# Patient Record
Sex: Female | Born: 1970 | Race: Black or African American | Hispanic: No | Marital: Married | Smoking: Never smoker
Health system: Southern US, Community
[De-identification: ages and names within clinical notes are randomized; demographics above are authoritative.]

## PROBLEM LIST (undated history)

## (undated) HISTORY — PX: KNEE SURGERY: SHX244

---

## 2002-02-04 ENCOUNTER — Emergency Department (HOSPITAL_COMMUNITY): Admission: EM | Admit: 2002-02-04 | Discharge: 2002-02-04 | Payer: Self-pay

## 2012-09-15 ENCOUNTER — Encounter: Payer: Self-pay | Admitting: *Deleted

## 2012-09-15 ENCOUNTER — Emergency Department
Admission: EM | Admit: 2012-09-15 | Discharge: 2012-09-15 | Disposition: A | Discharge status: Home / Self Care | Payer: 59 | Source: Home / Self Care | Attending: Emergency Medicine | Admitting: Emergency Medicine

## 2012-09-15 DIAGNOSIS — J069 Acute upper respiratory infection, unspecified: Secondary | ICD-10-CM

## 2012-09-15 LAB — POCT INFLUENZA A/B: Influenza B, POC: NEGATIVE

## 2012-09-15 MED ORDER — AZITHROMYCIN 250 MG PO TABS: ORAL_TABLET | ORAL | Status: AC

## 2012-09-15 NOTE — ED Notes (Signed)
Pt has been feeling sick for 3 days.  Cough fever chills body aches came on all of a sudden Thursday morning.  Mucinex, nyquil with no relief

## 2012-09-15 NOTE — ED Provider Notes (Signed)
History     CSN: [id number]  Arrival date & time 09/15/12  1613   First MD Initiated Contact with Patient 09/15/12 1629      No chief complaint on file.   (Consider location/radiation/quality/duration/timing/severity/associated sxs/prior treatment) HPI Becky Lester is a 42 y.o. female who complains of onset of cold symptoms for 3 days.  The symptoms are constant and mild-moderate in severity.  Today is the worst day.  No flu shot this year.  Husband and son were sick but now better.  Daughter is starting to get sick. No sore throat + cough No pleuritic pain No wheezing No nasal congestion No post-nasal drainage No sinus pain/pressure No chest congestion No itchy/red eyes No earache No hemoptysis No SOB + chills/sweats + fever No nausea No vomiting No abdominal pain No diarrhea No skin rashes + fatigue + myalgias No headache    No past medical history on file.  No past surgical history on file.  No family history on file.  History  Substance Use Topics  . Smoking status: Not on file  . Smokeless tobacco: Not on file  . Alcohol Use: Not on file    OB History    No data available      Review of Systems  All other systems reviewed and are negative.    Allergies  Review of patient's allergies indicates not on file.  Home Medications  No current outpatient prescriptions on file.  There were no vitals taken for this visit.  Physical Exam  Nursing note and vitals reviewed. Constitutional: She is oriented to person, place, and time. She appears well-developed and well-nourished.  HENT:  Head: Normocephalic and atraumatic.  Right Ear: Tympanic membrane, external ear and ear canal normal.  Left Ear: Tympanic membrane, external ear and ear canal normal.  Nose: Mucosal edema and rhinorrhea present.  Mouth/Throat: Posterior oropharyngeal erythema present. No oropharyngeal exudate or posterior oropharyngeal edema.  Eyes: No scleral icterus.  Neck: Neck  supple.  Cardiovascular: Regular rhythm and normal heart sounds.   Pulmonary/Chest: Effort normal and breath sounds normal. No respiratory distress.  Neurological: She is alert and oriented to person, place, and time.  Skin: Skin is warm and dry.  Psychiatric: She has a normal mood and affect. Her speech is normal.    ED Course  Procedures (including critical care time)  Labs Reviewed - No data to display No results found.   1. Acute upper respiratory infections of unspecified site       MDM  1)  Take the prescribed antibiotic as instructed.  Sounds flu-like, but rapid flu negative.  Will give Zpak which she will hold off until tomorrow which is day 4.  If viral or flu, may start feeling better by then.  Too late for Tamiflu for patient. 2)  Use nasal saline solution (over the counter) at least 3 times a day. 3)  Use over the counter decongestants like Zyrtec-D every 12 hours as needed to help with congestion.  If you have hypertension, do not take medicines with sudafed.  4)  Can take tylenol every 6 hours or motrin every 8 hours for pain or fever. 5)  Follow up with your primary doctor if no improvement in 5-7 days, sooner if increasing pain, fever, or new symptoms.     Marlaine Hind, MD 09/15/12 [phone]

## 2019-11-19 ENCOUNTER — Other Ambulatory Visit: Payer: Self-pay

## 2019-11-19 ENCOUNTER — Encounter (HOSPITAL_COMMUNITY): Payer: Self-pay | Admitting: Emergency Medicine

## 2019-11-19 ENCOUNTER — Emergency Department (HOSPITAL_COMMUNITY): Payer: BC Managed Care – PPO

## 2019-11-19 ENCOUNTER — Emergency Department (HOSPITAL_COMMUNITY)
Admission: EM | Admit: 2019-11-19 | Discharge: 2019-11-19 | Disposition: A | Discharge status: Home / Self Care | Payer: BC Managed Care – PPO | Attending: Emergency Medicine | Admitting: Emergency Medicine

## 2019-11-19 DIAGNOSIS — M542 Cervicalgia: Secondary | ICD-10-CM | POA: Diagnosis not present

## 2019-11-19 DIAGNOSIS — S0921XA Traumatic rupture of right ear drum, initial encounter: Secondary | ICD-10-CM | POA: Diagnosis not present

## 2019-11-19 DIAGNOSIS — T07XXXA Unspecified multiple injuries, initial encounter: Secondary | ICD-10-CM | POA: Diagnosis present

## 2019-11-19 DIAGNOSIS — S0083XA Contusion of other part of head, initial encounter: Secondary | ICD-10-CM | POA: Diagnosis not present

## 2019-11-19 DIAGNOSIS — R42 Dizziness and giddiness: Secondary | ICD-10-CM | POA: Diagnosis not present

## 2019-11-19 DIAGNOSIS — Y939 Activity, unspecified: Secondary | ICD-10-CM | POA: Insufficient documentation

## 2019-11-19 DIAGNOSIS — H0589 Other disorders of orbit: Secondary | ICD-10-CM | POA: Diagnosis not present

## 2019-11-19 DIAGNOSIS — H7291 Unspecified perforation of tympanic membrane, right ear: Secondary | ICD-10-CM

## 2019-11-19 DIAGNOSIS — Y999 Unspecified external cause status: Secondary | ICD-10-CM | POA: Insufficient documentation

## 2019-11-19 DIAGNOSIS — Y92019 Unspecified place in single-family (private) house as the place of occurrence of the external cause: Secondary | ICD-10-CM | POA: Insufficient documentation

## 2019-11-19 MED ORDER — PROCHLORPERAZINE EDISYLATE 10 MG/2ML IJ SOLN
10.0000 mg | Freq: Once | INTRAMUSCULAR | Status: AC
  Administered 2019-11-19: 10 mg via INTRAVENOUS
  Filled 2019-11-19: qty 2

## 2019-11-19 MED ORDER — HYDROCODONE-ACETAMINOPHEN 5-325 MG PO TABS
1.0000 | ORAL_TABLET | Freq: Once | ORAL | Status: AC
  Administered 2019-11-19: 1 via ORAL
  Filled 2019-11-19: qty 1

## 2019-11-19 MED ORDER — MECLIZINE HCL 25 MG PO TABS: 25.0000 mg | ORAL_TABLET | Freq: Three times a day (TID) | ORAL | 0 refills | Status: AC | PRN

## 2019-11-19 MED ORDER — HYDROMORPHONE HCL 1 MG/ML IJ SOLN
1.0000 mg | Freq: Once | INTRAMUSCULAR | Status: AC
  Administered 2019-11-19: 1 mg via INTRAMUSCULAR
  Filled 2019-11-19: qty 1

## 2019-11-19 MED ORDER — ONDANSETRON 4 MG PO TBDP
4.0000 mg | ORAL_TABLET | Freq: Once | ORAL | Status: AC
  Administered 2019-11-19: 4 mg via ORAL
  Filled 2019-11-19: qty 1

## 2019-11-19 MED ORDER — DIPHENHYDRAMINE HCL 50 MG/ML IJ SOLN
12.5000 mg | Freq: Once | INTRAMUSCULAR | Status: AC
  Administered 2019-11-19: 12.5 mg via INTRAVENOUS
  Filled 2019-11-19: qty 1

## 2019-11-19 MED ORDER — MECLIZINE HCL 25 MG PO TABS
25.0000 mg | ORAL_TABLET | Freq: Once | ORAL | Status: AC
  Administered 2019-11-19: 25 mg via ORAL
  Filled 2019-11-19: qty 1

## 2019-11-19 MED ORDER — KETOROLAC TROMETHAMINE 30 MG/ML IJ SOLN
30.0000 mg | Freq: Once | INTRAMUSCULAR | Status: AC
  Administered 2019-11-19: 30 mg via INTRAVENOUS
  Filled 2019-11-19: qty 1

## 2019-11-19 MED ORDER — SODIUM CHLORIDE 0.9 % IV BOLUS
1000.0000 mL | Freq: Once | INTRAVENOUS | Status: AC
  Administered 2019-11-19: 1000 mL via INTRAVENOUS

## 2019-11-19 MED ORDER — OFLOXACIN 0.3 % OT SOLN: 5.0000 [drp] | Freq: Two times a day (BID) | OTIC | 0 refills | Status: AC

## 2019-11-19 NOTE — ED Notes (Signed)
Patient returned from CT

## 2019-11-19 NOTE — ED Notes (Signed)
Brandon PA at bedside. 
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
## 2019-11-19 NOTE — ED Notes (Signed)
[employer] officer Huges to bedside to speak with pt.
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
## 2022-05-16 ENCOUNTER — Ambulatory Visit (INDEPENDENT_AMBULATORY_CARE_PROVIDER_SITE_OTHER): Payer: PRIVATE HEALTH INSURANCE | Admitting: Podiatry

## 2022-05-16 ENCOUNTER — Ambulatory Visit (INDEPENDENT_AMBULATORY_CARE_PROVIDER_SITE_OTHER): Payer: PRIVATE HEALTH INSURANCE

## 2022-05-16 DIAGNOSIS — M7751 Other enthesopathy of right foot: Secondary | ICD-10-CM

## 2022-05-16 DIAGNOSIS — M7752 Other enthesopathy of left foot: Secondary | ICD-10-CM

## 2022-05-16 MED ORDER — BETAMETHASONE SOD PHOS & ACET 6 (3-3) MG/ML IJ SUSP
3.0000 mg | Freq: Once | INTRAMUSCULAR | Status: AC
  Administered 2022-05-16: 3 mg via INTRA_ARTICULAR

## 2022-05-16 MED ORDER — METHYLPREDNISOLONE 4 MG PO TBPK: ORAL_TABLET | ORAL | 0 refills | Status: DC

## 2022-05-16 MED ORDER — MELOXICAM 15 MG PO TABS: 15.0000 mg | ORAL_TABLET | Freq: Every day | ORAL | 1 refills | Status: AC

## 2022-05-16 NOTE — Progress Notes (Signed)
   Chief Complaint  Patient presents with   Foot Pain    The patient is here for bilateral foot pain that she has had for years, she has had injection in the right foot  before.patient states that the injection helped a lot and would like the injection.    HPI: 51 y.o. female presenting today as a new patient for evaluation of second toe joint pain that has been going on for about 7-8 months now.  Patient states that several years ago she had cortisone injections into the second MTP joint which helped significantly alleviate her pain and symptoms.  She says that about 7-8 months ago she went to a concert and was on her feet and that is when the pain returned.  She presents for further treatment and evaluation  No past medical history on file.  Past Surgical History:  Procedure Laterality Date   KNEE SURGERY      No Known Allergies   Physical Exam: General: The patient is alert and oriented x3 in no acute distress.  Dermatology: Skin is warm, dry and supple bilateral lower extremities. Negative for open lesions or macerations.  Vascular: Palpable pedal pulses bilaterally. Capillary refill within normal limits.  Negative for any significant edema or erythema  Neurological: Light touch and protective threshold grossly intact  Musculoskeletal Exam: Mild to moderate hallux valgus deformity noted.  There is pain on palpation and range of motion of the second MTP bilateral  Radiographic Exam:  Normal osseous mineralization. Joint spaces preserved.  Mild increased intermetatarsal space with increased intermetatarsal angle and hallux abductus angle noted bilateral right greater than the left  Assessment: 1.  Mild to moderate hallux valgus bilateral; asymptomatic 2.  Second MTP capsulitis bilateral   Plan of Care:  1. Patient evaluated. X-Rays reviewed.  2.  Injection of 0.5 cc Celestone Soluspan injection second MTP bilateral 3.  Prescription for Medrol Dosepak 4.  Prescription for  meloxicam 15 mg daily 5.  Advised against going barefoot.  Recommend good supportive shoes and sneakers with arch supports to offload pressure from the forefoot 6.  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 Edrick Kins, DPM    [address], [place] [address]                Office [phone]  Fax [phone]

## 2022-05-16 NOTE — Progress Notes (Signed)
Dg  

## 2022-09-06 ENCOUNTER — Ambulatory Visit (INDEPENDENT_AMBULATORY_CARE_PROVIDER_SITE_OTHER): Payer: Commercial Managed Care - PPO | Admitting: Podiatry

## 2022-09-06 VITALS — BP 151/90 | HR 88

## 2022-09-06 DIAGNOSIS — M7752 Other enthesopathy of left foot: Secondary | ICD-10-CM | POA: Diagnosis not present

## 2022-09-06 DIAGNOSIS — M7751 Other enthesopathy of right foot: Secondary | ICD-10-CM

## 2022-09-06 MED ORDER — BETAMETHASONE SOD PHOS & ACET 6 (3-3) MG/ML IJ SUSP
3.0000 mg | Freq: Once | INTRAMUSCULAR | Status: AC
  Administered 2022-09-06: 3 mg via INTRA_ARTICULAR

## 2022-09-06 NOTE — Progress Notes (Signed)
   Chief Complaint  Patient presents with   Foot Pain    Capsulitis bilateral foot, patient is having pain in the forefoot under the hallux, patient is wanting an injections,     HPI: 52 y.o. female presenting today for follow-up evaluation of second toe joint pain that has been going on for about 1 year now.  Patient states that the cortisone injection she received last visit helped for about 3 months.  Slowly the pain is returned.  She presents for further treatment and evaluation No past medical history on file.  Past Surgical History:  Procedure Laterality Date   KNEE SURGERY      No Known Allergies   Physical Exam: General: The patient is alert and oriented x3 in no acute distress.  Dermatology: Skin is warm, dry and supple bilateral lower extremities. Negative for open lesions or macerations.  Vascular: Palpable pedal pulses bilaterally. Capillary refill within normal limits.  Negative for any significant edema or erythema  Neurological: Light touch and protective threshold grossly intact  Musculoskeletal Exam: Mild to moderate hallux valgus deformity noted.  There is pain on palpation and range of motion of the second MTP bilateral  Radiographic Exam B/L feet 05/16/2022:  Normal osseous mineralization. Joint spaces preserved.  Mild increased intermetatarsal space with increased intermetatarsal angle and hallux abductus angle noted bilateral right greater than the left  Assessment: 1.  Mild to moderate hallux valgus bilateral; asymptomatic 2.  Second MTP capsulitis bilateral   Plan of Care:  1. Patient evaluated. X-Rays reviewed again today explaining the protruding length of the second metatarsal head extending beyond the normal metatarsal parabola.  2.  Injection of 0.5 cc Celestone Soluspan injection second MTP bilateral 3.  Continue to advise against going barefoot.  Patient states that she does go barefoot around the house 4.  Today we did discuss the possibility of  surgery which would include shortening osteotomies of the second metatarsals bilateral.  This was explained in detail to the patient.  This is something to consider if she continues to fail conservative treatment 5.  Patient has a pair of custom molded orthotics but she found them very uncomfortable.  She states that she will try to wear them again.  These orthotics were dispensed in [place], [place] 6.  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 Edrick Kins, DPM    [address], [place] [address]                Office [phone]  Fax [phone]

## 2023-01-17 ENCOUNTER — Ambulatory Visit: Payer: Commercial Managed Care - PPO | Admitting: Podiatry

## 2023-01-25 ENCOUNTER — Ambulatory Visit (INDEPENDENT_AMBULATORY_CARE_PROVIDER_SITE_OTHER): Payer: Commercial Managed Care - PPO | Admitting: Podiatrist

## 2023-01-25 ENCOUNTER — Encounter: Payer: Self-pay | Admitting: Podiatrist

## 2023-01-25 DIAGNOSIS — M7751 Other enthesopathy of right foot: Secondary | ICD-10-CM

## 2023-01-25 DIAGNOSIS — M7752 Other enthesopathy of left foot: Secondary | ICD-10-CM

## 2023-01-25 MED ORDER — DEXAMETHASONE SODIUM PHOSPHATE 120 MG/30ML IJ SOLN
4.0000 mg | Freq: Once | INTRAMUSCULAR | Status: AC
Start: 2023-01-25 — End: 2023-01-25
  Administered 2023-01-25: 4 mg via INTRA_ARTICULAR

## 2023-01-25 NOTE — Progress Notes (Signed)
Chief Complaint  Patient presents with   Injections    Pt stated that she normally sees Becky Lester and he gives her injections for her feet and she is wanting to get them in both today      HPI: Patient is 52 y.o. female who presents today for discomfort second metatarsal phalangeal joint bilateral.  She states he normally sees Becky Lester and relates that the injections she has gotten in the past have helped.  She also relates that she knows she cannot continue to get injections and that surgery may be needed in the future.   No Known Allergies  Review of systems is negative except as noted in the HPI.  Denies nausea/ vomiting/ fevers/ chills or night sweats.   Denies difficulty breathing, denies calf pain or tenderness  Physical Exam  Patient is awake, alert, and oriented x 3.  In no acute distress.    Vascular status is intact with palpable pedal pulses DP and PT bilateral and capillary refill time less than 3 seconds bilateral.  No edema or erythema noted.   Neurological exam reveals epicritic and protective sensation grossly intact bilateral.   Dermatological exam reveals skin is supple and dry to bilateral feet.  No open lesions present.    Musculoskeletal exam: Elongated second digit is noted.  Slight dorsiflexion upon forefoot loading of the second digit is also seen.  This is a bilateral finding. Elongated second metatarsals with medial deviation of the second digits is noted bilateral.  Pain on palpation segment of tarsal phalangeal joints noted bilateral.  Assessment:   ICD-10-CM   1. Capsulitis of metatarsophalangeal (MTP) joint of left foot  M77.52     2. Capsulitis of metatarsophalangeal (MTP) joint of right foot  M77.51        Plan: Discussed exam findings with Becky Lester at today's visit.  Discussed her specific capsulitis and also discussed benefits and risks of the injections.  Specifically discussed that the injections could cause the toes to drift medial/lateral or  dorsally.  Patient is aware of the risks and states that the pain is such that she would like to proceed.  I did agree up of the skin with alcohol infiltrated 0.5 cc of dexamethasone with 0.5 cc of Marcaine plain medial to the second metatarsal phalangeal joint bilateral.  Also recommended that she take the toes in a downward position.   Recommended she follow-up with Becky Lester to discuss treatment options further.

## 2023-04-09 ENCOUNTER — Ambulatory Visit (INDEPENDENT_AMBULATORY_CARE_PROVIDER_SITE_OTHER): Payer: Commercial Managed Care - PPO

## 2023-04-09 ENCOUNTER — Encounter: Payer: Self-pay | Admitting: Podiatry

## 2023-04-09 ENCOUNTER — Ambulatory Visit (INDEPENDENT_AMBULATORY_CARE_PROVIDER_SITE_OTHER): Payer: Commercial Managed Care - PPO | Admitting: Podiatry

## 2023-04-09 DIAGNOSIS — M2012 Hallux valgus (acquired), left foot: Secondary | ICD-10-CM

## 2023-04-09 DIAGNOSIS — M7752 Other enthesopathy of left foot: Secondary | ICD-10-CM

## 2023-04-09 DIAGNOSIS — M7751 Other enthesopathy of right foot: Secondary | ICD-10-CM

## 2023-04-09 DIAGNOSIS — M2011 Hallux valgus (acquired), right foot: Secondary | ICD-10-CM

## 2023-04-09 NOTE — Progress Notes (Unsigned)
   Chief Complaint  Patient presents with   Consult    Discuss surgery - continued forefoot pain, having to get injections every 2-3 months, wants to talk about surgical options    HPI: 52 y.o. female presenting today for follow-up evaluation of second toe joint pain that has been going on for about 1 year now.  Patient states that the cortisone injection she received last visit helped for about 3 months.  Slowly the pain is returned.  She presents for further treatment and evaluation No past medical history on file.  Past Surgical History:  Procedure Laterality Date   KNEE SURGERY      No Known Allergies   Physical Exam: General: The patient is alert and oriented x3 in no acute distress.  Dermatology: Skin is warm, dry and supple bilateral lower extremities. Negative for open lesions or macerations.  Vascular: Palpable pedal pulses bilaterally. Capillary refill within normal limits.  Negative for any significant edema or erythema  Neurological: Light touch and protective threshold grossly intact  Musculoskeletal Exam: Mild to moderate hallux valgus deformity noted.  There is pain on palpation and range of motion of the second MTP bilateral  Radiographic Exam B/L feet 05/16/2022:  Normal osseous mineralization. Joint spaces preserved.  Mild increased intermetatarsal space with increased intermetatarsal angle and hallux abductus angle noted bilateral right greater than the left  Assessment: 1.  Mild to moderate hallux valgus bilateral; asymptomatic 2.  Second MTP capsulitis bilateral   Plan of Care:  1. Patient evaluated. X-Rays reviewed again today explaining the protruding length of the second metatarsal head extending beyond the normal metatarsal parabola.  2.  Injection of 0.5 cc Celestone Soluspan injection second MTP bilateral 3.  Continue to advise against going barefoot.  Patient states that she does go barefoot around the house 4.  Today we did discuss the possibility  of surgery which would include shortening osteotomies of the second metatarsals bilateral.  This was explained in detail to the patient.  This is something to consider if she continues to fail conservative treatment 5.  Patient has a pair of custom molded orthotics but she found them very uncomfortable.  She states that she will try to wear them again.  These orthotics were dispensed in [place], [place] 6.  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 Felecia Shelling, DPM    [address] [place], [place] [address]                Office [phone]  Fax [phone]

## 2023-04-10 DIAGNOSIS — M7752 Other enthesopathy of left foot: Secondary | ICD-10-CM | POA: Diagnosis not present

## 2023-04-10 DIAGNOSIS — M7751 Other enthesopathy of right foot: Secondary | ICD-10-CM

## 2023-04-10 MED ORDER — BETAMETHASONE SOD PHOS & ACET 6 (3-3) MG/ML IJ SUSP
3.0000 mg | Freq: Once | INTRAMUSCULAR | Status: AC
Start: 2023-04-10 — End: 2023-04-10
  Administered 2023-04-10: 3 mg via INTRA_ARTICULAR

## 2023-07-02 ENCOUNTER — Ambulatory Visit (INDEPENDENT_AMBULATORY_CARE_PROVIDER_SITE_OTHER): Payer: Commercial Managed Care - PPO | Admitting: Podiatry

## 2023-07-02 ENCOUNTER — Encounter: Payer: Self-pay | Admitting: Podiatry

## 2023-07-02 VITALS — Ht 66.0 in | Wt 145.0 lb

## 2023-07-02 DIAGNOSIS — M7751 Other enthesopathy of right foot: Secondary | ICD-10-CM | POA: Diagnosis not present

## 2023-07-02 DIAGNOSIS — M7752 Other enthesopathy of left foot: Secondary | ICD-10-CM | POA: Diagnosis not present

## 2023-07-02 MED ORDER — BETAMETHASONE SOD PHOS & ACET 6 (3-3) MG/ML IJ SUSP
3.0000 mg | Freq: Once | INTRAMUSCULAR | Status: AC
  Administered 2023-07-02: 3 mg via INTRA_ARTICULAR

## 2023-07-02 NOTE — Progress Notes (Signed)
   Chief Complaint  Patient presents with   Foot Pain    Patient is here for 16M F/U for Mild to moderate hallux valgus bilateral; asymptomatic,  Second MTP capsulitis bilateral with elongated metatarsals  Patient is here for injection and schedule surgery    HPI: 52 y.o. female presenting today for follow-up evaluation of second toe joint pain that has been going on for about 1 year now.  Patient continues to have pain and tenderness associated to the second toe joint.  Injections have helped temporarily in the past  History reviewed. No pertinent past medical history.  Past Surgical History:  Procedure Laterality Date   KNEE SURGERY      No Known Allergies   Physical Exam: General: The patient is alert and oriented x3 in no acute distress.  Dermatology: Skin is warm, dry and supple bilateral lower extremities. Negative for open lesions or macerations.  Vascular: Palpable pedal pulses bilaterally. Capillary refill within normal limits.  Negative for any significant edema or erythema  Neurological: Light touch and protective threshold grossly intact  Musculoskeletal Exam: Mild to moderate hallux valgus deformity noted.  There is pain on palpation and range of motion of the second MTP bilateral  Radiographic Exam B/L feet 04/10/2023:  Normal osseous mineralization. Joint spaces preserved.  Mild increased intermetatarsal space with increased intermetatarsal angle and hallux abductus angle noted bilateral right greater than the left.  The second metatarsal heads do extend beyond the metatarsal parabola likely contributing to the patient's pain specifically to the second MTP  Assessment: 1.  Mild to moderate hallux valgus bilateral; asymptomatic 2.  Second MTP capsulitis bilateral with elongated metatarsals    Plan of Care:  -Patient evaluated. X-Rays reviewed again today explaining the protruding length of the second metatarsal head extending beyond the normal metatarsal parabola.   -Injection of 0.5 cc Celestone Soluspan injection second MTP bilateral - Authorization for surgery was initiated today.  Surgery will consist of Weil shortening osteotomy second metatarsal bilateral -Risk benefits advantages disadvantages of the procedure as well as the postoperative recovery course were explained in detail to the patient.  No guarantees were expressed or implied.  All patient questions answered.  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 Felecia Lester, DPM    [address] [place], [place] [address]                Office [phone]  Fax [phone]

## 2023-07-04 ENCOUNTER — Telehealth: Payer: Self-pay

## 2023-07-04 NOTE — Telephone Encounter (Signed)
Received surgery paperwork from Dr. Logan Bores. Tried calling the number listed but it is not longer in service.

## 2023-08-03 ENCOUNTER — Telehealth: Payer: Self-pay | Admitting: Podiatry

## 2023-08-03 NOTE — Telephone Encounter (Signed)
DOS-09/06/23  MET. OSTEOTOMY 2ND [id number]  COVERAGE EFFECTIVE DATE- 08/14/21  DEDUCTIBLE- $750.00 WITH REMAINING $0.00 OOP-$3000.00 WITH REMAINING $1973.00  COINSURANCE- 20%  SPOKE WITH SHEN D FROM [facility] SERVICES, AND SHE STATED THAT PRIOR AUTH IS NOT REQUIRED FOR CPT CODE 09811  CALL REFERENCE #: [id number]

## 2023-08-30 ENCOUNTER — Telehealth: Payer: Self-pay | Admitting: Podiatry

## 2023-08-30 NOTE — Telephone Encounter (Signed)
Aram Beecham from [facility] called and stated that pt has called and would like to reschedule her surgery with Dr.Evans

## 2023-08-31 ENCOUNTER — Telehealth: Payer: Self-pay | Admitting: Urology

## 2023-08-31 NOTE — Telephone Encounter (Signed)
Pt called yesterday stating that she needs to cxl her sx with Dr. Logan Bores on 09/06/23, she just finished school and her husband is making her get a job first. She will call back when she is ready to reschedule. Aram Beecham with [facility] and Dr. Logan Bores have been informed of this change.

## 2023-09-12 ENCOUNTER — Encounter: Payer: Commercial Managed Care - PPO | Admitting: Podiatry

## 2023-09-19 ENCOUNTER — Encounter: Payer: Commercial Managed Care - PPO | Admitting: Podiatry

## 2023-10-03 ENCOUNTER — Encounter: Payer: Commercial Managed Care - PPO | Admitting: Podiatry

## 2024-01-15 ENCOUNTER — Ambulatory Visit (INDEPENDENT_AMBULATORY_CARE_PROVIDER_SITE_OTHER): Admitting: Podiatry

## 2024-01-15 ENCOUNTER — Encounter: Payer: Self-pay | Admitting: Podiatry

## 2024-01-15 VITALS — Ht 66.0 in | Wt 145.0 lb

## 2024-01-15 DIAGNOSIS — M7751 Other enthesopathy of right foot: Secondary | ICD-10-CM

## 2024-01-15 DIAGNOSIS — M7752 Other enthesopathy of left foot: Secondary | ICD-10-CM | POA: Diagnosis not present

## 2024-01-15 MED ORDER — BETAMETHASONE SOD PHOS & ACET 6 (3-3) MG/ML IJ SUSP
3.0000 mg | Freq: Once | INTRAMUSCULAR | Status: AC
  Administered 2024-01-15: 3 mg via INTRA_ARTICULAR

## 2024-01-15 NOTE — Progress Notes (Signed)
   Chief Complaint  Patient presents with   Foot Pain    Pt is here due to left foot pain that is constant and been going on for a few months, would like to get injection, she also states she was suppose to have foot surgery in February and had to cancel she would like to get that process going again.     HPI: 53 y.o. female presenting today for follow-up evaluation of second toe joint pain that has been going on for over 1 year now.  Last seen in the office on 07/02/2023 and at that time we had decided to proceed with surgery to correct for the chronically painful second toe joint.  She had to cancel for personal reasons.  Since that time she has continued to have pain and tenderness.  She would like to proceed with surgery and presents today for further treatment  No past medical history on file.  Past Surgical History:  Procedure Laterality Date   KNEE SURGERY      No Known Allergies   Physical Exam: General: The patient is alert and oriented x3 in no acute distress.  Dermatology: Skin is warm, dry and supple bilateral lower extremities. Negative for open lesions or macerations.  Vascular: Palpable pedal pulses bilaterally. Capillary refill within normal limits.  Negative for any significant edema or erythema  Neurological: Light touch and protective threshold grossly intact  Musculoskeletal Exam: Mild to moderate hallux valgus deformity noted.  There is pain on palpation and range of motion of the second MTP bilateral  Radiographic Exam B/L feet 04/10/2023:  Normal osseous mineralization. Joint spaces preserved.  Mild increased intermetatarsal space with increased intermetatarsal angle and hallux abductus angle noted bilateral right greater than the left.  The second metatarsal heads do extend beyond the metatarsal parabola likely contributing to the patient's pain specifically to the second MTP  Assessment: 1.  Mild to moderate hallux valgus bilateral; asymptomatic 2.  Second  MTP capsulitis bilateral with elongated metatarsals    Plan of Care:  -Patient evaluated.  -Injection of 0.5 cc Celestone  Soluspan injection second MTP left foot - Authorization for surgery was reinitiated today.  Surgery will consist of Weil shortening osteotomy second metatarsal bilateral -Risk benefits advantages disadvantages of the procedure as well as the postoperative recovery course were explained in detail to the patient.  No guarantees were expressed or implied.  All patient questions answered.  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 Dot Gazella, DPM    [address] [place], [place] [address]                Office [phone]  Fax [phone]

## 2024-01-31 ENCOUNTER — Telehealth: Payer: Self-pay

## 2024-01-31 NOTE — Telephone Encounter (Signed)
 DOS 02/07/2024  METATARSAL OSTEOTMY 2ND [id number]  [employer] SHARED SERVICES  SPOKE TO HEATHER AT [facility] AND SHE STATED NO AUTH IS REQUIRED FOR CPT 28308. CALL REF# HEATHER T 01/28/2024 203 CST  ALSO SPOKE TO MICHAEL IN PROVIDER SERVICES AND HE ALSO STATED NO PRECERT REQUIRED.CALL REF # [id number]

## 2024-02-07 ENCOUNTER — Other Ambulatory Visit: Payer: Self-pay | Admitting: Podiatry

## 2024-02-07 ENCOUNTER — Encounter: Payer: Self-pay | Admitting: Podiatry

## 2024-02-07 DIAGNOSIS — M21542 Acquired clubfoot, left foot: Secondary | ICD-10-CM | POA: Diagnosis not present

## 2024-02-07 DIAGNOSIS — M21541 Acquired clubfoot, right foot: Secondary | ICD-10-CM | POA: Diagnosis not present

## 2024-02-07 MED ORDER — OXYCODONE-ACETAMINOPHEN 5-325 MG PO TABS: 1.0000 | ORAL_TABLET | ORAL | 0 refills | Status: AC | PRN

## 2024-02-07 MED ORDER — IBUPROFEN 800 MG PO TABS: 800.0000 mg | ORAL_TABLET | Freq: Three times a day (TID) | ORAL | 1 refills | Status: AC

## 2024-02-07 NOTE — Progress Notes (Signed)
 PRN postop

## 2024-02-12 ENCOUNTER — Ambulatory Visit (INDEPENDENT_AMBULATORY_CARE_PROVIDER_SITE_OTHER)

## 2024-02-12 ENCOUNTER — Encounter: Payer: Self-pay | Admitting: Podiatry

## 2024-02-12 ENCOUNTER — Ambulatory Visit (INDEPENDENT_AMBULATORY_CARE_PROVIDER_SITE_OTHER): Admitting: Podiatry

## 2024-02-12 VITALS — Ht 66.0 in | Wt 145.0 lb

## 2024-02-12 DIAGNOSIS — M7751 Other enthesopathy of right foot: Secondary | ICD-10-CM

## 2024-02-12 DIAGNOSIS — M7752 Other enthesopathy of left foot: Secondary | ICD-10-CM | POA: Diagnosis not present

## 2024-02-12 DIAGNOSIS — Z9889 Other specified postprocedural states: Secondary | ICD-10-CM

## 2024-02-12 NOTE — Progress Notes (Signed)
   Chief Complaint  Patient presents with   Routine Post Op    POV # 1 DOS 02/07/24 BIL WEIL SHORTENING OSTEOTOMY 2ND MET, pt states her left foot is giving her some pain when she moves her foot a certain was a shock goes thru the top of foot down to 2nd toe, right foot is doing fine, no other complaints X-RAYS are done.     Subjective:  Patient presents today status post Weil shortening osteotomy to the second digits of bilateral.  DOS: 02/07/2024.  Doing well.  Currently WBAT surgical shoes as instructed.  No past medical history on file.  Past Surgical History:  Procedure Laterality Date   KNEE SURGERY      No Known Allergies  Objective/Physical Exam Neurovascular status intact.  Incision well coapted with sutures intact. No sign of infectious process noted. No dehiscence. No active bleeding noted.  Moderate edema noted to the surgical extremity.  Radiographic Exam B/L feet 02/12/2024:  Orthopedic hardware and osteotomies sites appear to be stable with routine healing.  Assessment: 1. s/p Weil shortening osteotomy second metatarsal bilateral. DOS: 02/07/2024   Plan of Care:  -Patient was evaluated. X-rays reviewed - Dressings changed.  Okay to wash and shower and get the foot wet.  Recommend triple anabiotic and a Band-Aid over the small incision sites -Continue minimal WBAT surgical shoe -Return to clinic 1 week suture removal   Thresa EMERSON Sar, DPM [facility]  Dr. Thresa EMERSON Sar, DPM    [address] [place], [place] [address]                Office [phone]  Fax [phone]

## 2024-02-21 ENCOUNTER — Ambulatory Visit (INDEPENDENT_AMBULATORY_CARE_PROVIDER_SITE_OTHER): Admitting: Podiatry

## 2024-02-21 DIAGNOSIS — M7752 Other enthesopathy of left foot: Secondary | ICD-10-CM

## 2024-02-21 DIAGNOSIS — Z9889 Other specified postprocedural states: Secondary | ICD-10-CM

## 2024-02-21 DIAGNOSIS — M7751 Other enthesopathy of right foot: Secondary | ICD-10-CM

## 2024-02-21 NOTE — Progress Notes (Signed)
   Chief Complaint  Patient presents with   Routine Post Op    POV # 2 DOS 02/07/24 BIL WEIL SHORTENING OSTEOTOMY 2ND MET    Subjective:  Patient presents today status post Weil shortening osteotomy to the second digits of bilateral.  DOS: 02/07/2024.  Doing well.  Currently WBAT surgical shoes as instructed.  No past medical history on file.  Past Surgical History:  Procedure Laterality Date   KNEE SURGERY      No Known Allergies  Objective/Physical Exam Neurovascular status intact.  Incision well coapted with sutures intact. No sign of infectious process noted. No dehiscence. No active bleeding noted.  Moderate edema noted to the surgical extremity.  Radiographic Exam B/L feet 02/12/2024:  Orthopedic hardware and osteotomies sites appear to be stable with routine healing.  Assessment: 1. s/p Weil shortening osteotomy second metatarsal bilateral. DOS: 02/07/2024   Plan of Care:  -Patient was evaluated. X-rays reviewed - Dressings changed.  He can begin washing and showering the foot. -Continue minimal WBAT surgical shoe - Sutures removed no complication noted.  Patient will follow-up with Dr. Janit in 2 weeks for final follow-up

## 2024-03-07 ENCOUNTER — Ambulatory Visit (INDEPENDENT_AMBULATORY_CARE_PROVIDER_SITE_OTHER)

## 2024-03-07 ENCOUNTER — Ambulatory Visit (INDEPENDENT_AMBULATORY_CARE_PROVIDER_SITE_OTHER): Admitting: Podiatry

## 2024-03-07 ENCOUNTER — Encounter: Payer: Self-pay | Admitting: Podiatry

## 2024-03-07 VITALS — Ht 66.0 in | Wt 145.0 lb

## 2024-03-07 DIAGNOSIS — M7751 Other enthesopathy of right foot: Secondary | ICD-10-CM

## 2024-03-07 DIAGNOSIS — M7752 Other enthesopathy of left foot: Secondary | ICD-10-CM

## 2024-03-07 NOTE — Progress Notes (Signed)
   Chief Complaint  Patient presents with   Routine Post Op    POV # 3 DOS 02/07/24 BIL WEIL SHORTENING OSTEOTOMY 2ND MET, pt is here to f/u on bilateral feet, she states that the left foot she is still having a little pain and swelling, the right foot is fine, no other complaints at this time.    Subjective:  Patient presents today status post Weil shortening osteotomy to the second digits of bilateral.  DOS: 02/07/2024.  Doing well.  Currently WBAT surgical shoes as instructed.  No past medical history on file.  Past Surgical History:  Procedure Laterality Date   KNEE SURGERY      No Known Allergies  Objective/Physical Exam Neurovascular status intact.  Incision nicely healed.  No edema or pain with palpation or range of motion second DP bilateral  Radiographic Exam B/L feet 03/07/2024:  Unchanged with routine healing.  Orthopedic hardware and osteotomies sites appear to be stable with routine healing.  Assessment: 1. s/p Weil shortening osteotomy second metatarsal bilateral. DOS: 02/07/2024   Plan of Care:  -Patient was evaluated. X-rays reviewed - Patient may slowly increase to full activity no restrictions of the following month -Discontinue postop shoe.  Recommend good supportive tennis shoes and sneakers.  Advised against going barefoot -Return to clinic PRN  Thresa EMERSON Sar, DPM [facility]  Dr. Thresa EMERSON Sar, DPM    [address] [place], [place] [address]                Office [phone]  Fax [phone]

## 2024-03-31 ENCOUNTER — Ambulatory Visit: Admitting: Podiatry
